# Patient Record
Sex: Male | Born: 1955 | Race: White | Hispanic: No | Marital: Married | Smoking: Former smoker
Health system: Southern US, Community
[De-identification: ages and names within clinical notes are randomized; demographics above are authoritative.]

## PROBLEM LIST (undated history)

## (undated) DIAGNOSIS — E7800 Pure hypercholesterolemia, unspecified: Secondary | ICD-10-CM

## (undated) DIAGNOSIS — D229 Melanocytic nevi, unspecified: Secondary | ICD-10-CM

## (undated) DIAGNOSIS — K5090 Crohn's disease, unspecified, without complications: Secondary | ICD-10-CM

## (undated) DIAGNOSIS — I10 Essential (primary) hypertension: Secondary | ICD-10-CM

## (undated) DIAGNOSIS — E785 Hyperlipidemia, unspecified: Secondary | ICD-10-CM

## (undated) HISTORY — DX: Melanocytic nevi, unspecified: D22.9

## (undated) HISTORY — DX: Pure hypercholesterolemia, unspecified: E78.00

## (undated) HISTORY — DX: Essential (primary) hypertension: I10

## (undated) HISTORY — DX: Hyperlipidemia, unspecified: E78.5

## (undated) HISTORY — PX: NO PAST SURGERIES: SHX2092

## (undated) HISTORY — DX: Crohn's disease, unspecified, without complications: K50.90

---

## 2005-07-24 ENCOUNTER — Ambulatory Visit: Payer: Self-pay | Admitting: Gastroenterology

## 2007-08-16 ENCOUNTER — Emergency Department (HOSPITAL_COMMUNITY): Admission: EM | Admit: 2007-08-16 | Discharge: 2007-08-17 | Payer: Self-pay | Admitting: Emergency Medicine

## 2014-05-02 ENCOUNTER — Encounter: Payer: Self-pay | Admitting: Cardiology

## 2014-06-20 ENCOUNTER — Ambulatory Visit: Payer: Self-pay | Admitting: Cardiology

## 2014-07-26 ENCOUNTER — Ambulatory Visit (INDEPENDENT_AMBULATORY_CARE_PROVIDER_SITE_OTHER): Payer: BC Managed Care – PPO | Admitting: Cardiology

## 2014-07-26 ENCOUNTER — Encounter: Payer: Self-pay | Admitting: Cardiology

## 2014-07-26 VITALS — BP 132/92 | HR 49 | Ht 68.25 in | Wt 181.0 lb

## 2014-07-26 DIAGNOSIS — I10 Essential (primary) hypertension: Secondary | ICD-10-CM

## 2014-07-26 DIAGNOSIS — R001 Bradycardia, unspecified: Secondary | ICD-10-CM | POA: Insufficient documentation

## 2014-07-26 DIAGNOSIS — I498 Other specified cardiac arrhythmias: Secondary | ICD-10-CM

## 2014-07-26 NOTE — Progress Notes (Signed)
[place] [address] [place], [place]  [address] Phone: [phone] Fax:  [phone]  Date:  07/26/2014   ID:  Michael Hernandez, DOB 06-Jul-1956, MRN [id number]  PCP:  Vidal Schwalbe, MD   History of Present Illness: Michael Hernandez is a 58 y.o. male with a history of hypertension, hyperlipidemia here for followup of marked bradycardia heart rate 44 with widened QRS. EKG performed on 05/19/12 demonstrated sinus bradycardia with interventricular conduction delay. PR interval is 150 ms. QRS duration is 118 ms. His TSH is normal, LDL was 146 and his electrolytes were normal. He has a history of Crohn's disease. He is not on any AV nodal blocking agents.   When he wakes up it is slow. 40's at time.   he did have an episode of what sounds like orthostatic hypotension in the middle of the night, awoke in, went to the bathroom, briefly felt like he lost consciousness. Stable otherwise. She's not having any symptoms when driving, working out. Heart rate 138 after Elliptical.   He does occasionally have left chest discomfort when working out which seems to be more musculoskeletal in character, somewhat constant at times, nonexertional, he is left-handed.   Wt Readings from Last 3 Encounters:  07/26/14 181 lb (82.101 kg)     Past Medical History  Diagnosis Date  . Hyperlipidemia   . Hypertension     BradyCardia heart rate 44 with Widened QRS. EKG on 05/19/12  demonstrates sinus bradycardia with interventricular conduction delay. PR Interval is 191ms  . Crohn's disease     [name]  . Nevus     with slight to moderate atypia  . Hypercholesteremia     No past surgical history on file.  Current Outpatient Prescriptions  Medication Sig Dispense Refill  . benazepril (LOTENSIN) 20 MG tablet Take 20 mg by mouth daily. 1/2 Tablet Daily      . Multiple Vitamin (MULTIVITAMIN) capsule Take 1 capsule by mouth daily.       No current facility-administered medications for this visit.     Allergies:    Allergies  Allergen Reactions  . Diltiazem Hcl     Avoid due to Bradycardia  . Metoprolol     Avoid due to Bradycardia    Social History:  The patient  reports that he has quit smoking. He does not have any smokeless tobacco history on file.   Family History  Problem Relation Age of Onset  . Hypertension Mother   . Multiple sclerosis Mother   . COPD Mother   . CVA Maternal Grandfather 47    ROS:  Please see the history of present illness.  No bleeding, no shortness of breath.  All other systems reviewed and negative.   PHYSICAL EXAM: VS:  BP 132/92  Pulse 49  Wt 181 lb (82.101 kg) Well nourished, well developed, in no acute distress HEENT: normal, [place]/AT, EOMI Neck: no JVD, normal carotid upstroke, no bruit Cardiac:  normal S1, S2; RRR; no murmur Lungs:  clear to auscultation bilaterally, no wheezing, rhonchi or rales Abd: soft, nontender, no hepatomegaly, no bruits Ext: no edema, 2+ distal pulses Skin: warm and dry GU: deferred Neuro: no focal abnormalities noted, AAO x 3  EKG:  07/26/14-sinus bradycardia rate 49, left axis deviation, no significant change from prior. Normal intervals.    Labs: Tc around 200.   ASSESSMENT AND PLAN:  1. Sinus bradycardia-continue to monitor, no high-risk symptoms such as syncope, angina. He  is not on any AV nodal blocking agents. He is able to exercise demonstrating chronotropic competence. Doing well. 2. Orthostatic hypotension - infrequent occurrence. Be careful when getting out of bed especially in the middle of the night. We discussed 3. One-year followup.  Signed, Candee Furbish, MD [facility]  07/26/2014 9:51 AM

## 2014-07-26 NOTE — Patient Instructions (Signed)
The current medical regimen is effective;  continue present plan and medications.  Follow up in 1 year with Dr Skains.  You will receive a letter in the mail 2 months before you are due.  Please call us when you receive this letter to schedule your follow up appointment.  

## 2015-08-23 ENCOUNTER — Telehealth: Payer: Self-pay | Admitting: Cardiology

## 2015-08-23 NOTE — Telephone Encounter (Signed)
As he was told on the phone to increase his fluid status which I agree with. His blood pressure was elevated once he checked it at home but this could've been exacerbated by stress. His bradycardia, heart rates of 44 for instance, have been quite chronic. I would like to encourage conservative management at this time with hydration, eating adequate breakfast. If symptoms return, we will have low threshold for 30 day event monitor to make sure that his sinus bradycardia is not decreasing significantly.  Candee Furbish, MD

## 2015-08-23 NOTE — Telephone Encounter (Signed)
New Message  Pt c/o Syncope: STAT if syncope occurred within 30 minutes and pt complains of lightheadedness High Priority if episode of passing out, completely, today or in last 24 hours   1. Did you pass out today? No   2. When is the last time you passed out? No   3. Has this occurred multiple times? NO  4. Did you have any symptoms prior to passing out? No  Comments: Pt called states that he has not passed out but he does have symptoms of being light headed. We have made him an appt with Dr. Marlou Porch. Pt declined appt for 11/01 with PA-C for a sooner appt. Please assist.

## 2015-08-23 NOTE — Telephone Encounter (Signed)
Spoke with pt about his s/s today - he reports he felt like he was having trouble focusing his eyes this AM around 9 while sitting at his desk.  He felt faint and got up to walk around.  His vision cleared up but he is not feeling himself now and has somewhat of a headache.  He went home to check his BP/P which was 155/96 and 47.  The heart rate is not abnormal for him however the BP is elevated from his normal.  At repeat it was 153/91/44.  He is going to continue to monitor it.  He is aware I will forward this information to Dr Marlou Porch for review and further instructions.

## 2015-08-23 NOTE — Telephone Encounter (Signed)
Reviewed information with pt - who states understanding.  He reports he thinks he is starting to get some type of cold or something starting.  H/A and eyes burning like he maybe has a fever or something.  Highest BP today was 179/93 however pt admits to making himself very anxious about things and causing his BP to be elevated. Last BP131/81 HR 44. He will continue to monitor his BP and HR and let us know if he has any further s/s.  He is aware Dr Marlou Porch may order for him to wear a 30 day event monitor if s/s continue along with the bradycardia.

## 2015-09-24 ENCOUNTER — Encounter: Payer: Self-pay | Admitting: Cardiology

## 2015-09-24 ENCOUNTER — Ambulatory Visit (INDEPENDENT_AMBULATORY_CARE_PROVIDER_SITE_OTHER): Payer: BLUE CROSS/BLUE SHIELD | Admitting: Cardiology

## 2015-09-24 VITALS — BP 180/110 | HR 54 | Ht 69.0 in | Wt 182.1 lb

## 2015-09-24 DIAGNOSIS — R079 Chest pain, unspecified: Secondary | ICD-10-CM | POA: Diagnosis not present

## 2015-09-24 DIAGNOSIS — R0600 Dyspnea, unspecified: Secondary | ICD-10-CM | POA: Diagnosis not present

## 2015-09-24 DIAGNOSIS — I10 Essential (primary) hypertension: Secondary | ICD-10-CM | POA: Diagnosis not present

## 2015-09-24 DIAGNOSIS — R001 Bradycardia, unspecified: Secondary | ICD-10-CM

## 2015-09-24 NOTE — Patient Instructions (Signed)
Medication Instructions:  The current medical regimen is effective;  continue present plan and medications.  Testing/Procedures: Your physician has requested that you have an echocardiogram. Echocardiography is a painless test that uses sound waves to create images of your heart. It provides your doctor with information about the size and shape of your heart and how well your heart's chambers and valves are working. This procedure takes approximately one hour. There are no restrictions for this procedure.  Your physician has requested that you have an exercise tolerance test. For further information please visit [email or web address]. Please also follow instruction sheet, as given.   Follow-Up: Follow up will be based on the results of the above testing.  If you need a refill on your cardiac medications before your next appointment, please call your pharmacy.  Thank you for choosing [facility]!!

## 2015-09-24 NOTE — Progress Notes (Signed)
[place]. [address], [address] [place], [place]  [address] Phone: [phone] Fax:  [phone]  Date:  09/24/2015   ID:  WILKINS MANCE, DOB Jan 19, 1956, MRN [id number]  PCP:  Vidal Schwalbe, MD   History of Present Illness: Michael Hernandez is a 59 y.o. male with a history of hypertension, hyperlipidemia here for followup of marked bradycardia heart rate 44 with widened QRS. EKG performed on 05/19/12 demonstrated sinus bradycardia with interventricular conduction delay. PR interval is 150 ms. QRS duration is 118 ms. His TSH is normal, LDL was 146 and his electrolytes were normal. He has a history of Crohn's disease. He is not on any AV nodal blocking agents.   More winded when going up stairs. Worried.   Was sitting at desk, trouble focusing. Felt funny. Blurry. Strange. Dizzy like empty stomach. Head down. Panicing. Walked around, 5 min went away. BP was high at time.   When he wakes up it is slow. 40's at time.  He did have an episode of what sounds like orthostatic hypotension in the middle of the night, awoke in, went to the bathroom, briefly felt like he lost consciousness. Stable otherwise. She's not having any symptoms when driving, working out. Heart rate 138 after Elliptical. 60 min.  He is off of his Lotensin.   He was worried about his blood pressure becoming elevated. Taking cold medication. 08/23/15.  He does occasionally have left chest discomfort when working out which seems to be more musculoskeletal in character, somewhat constant at times, nonexertional, he is left-handed.   Wt Readings from Last 3 Encounters:  09/24/15 182 lb 1.9 oz (82.609 kg)  07/26/14 181 lb (82.101 kg)     Past Medical History  Diagnosis Date  . Hyperlipidemia   . Hypertension     BradyCardia heart rate 44 with Widened QRS. EKG on 05/19/12  demonstrates sinus bradycardia with interventricular conduction delay. PR Interval is 198ms  . Crohn's disease ([place])     [place]  . Nevus     with  slight to moderate atypia  . Hypercholesteremia     No past surgical history on file.  Current Outpatient Prescriptions  Medication Sig Dispense Refill  . Multiple Vitamin (MULTIVITAMIN) capsule Take 1 capsule by mouth daily.     No current facility-administered medications for this visit.    Allergies:    Allergies  Allergen Reactions  . Diltiazem Hcl     Avoid due to Bradycardia  . Metoprolol     Avoid due to Bradycardia    Social History:  The patient  reports that he has quit smoking. He does not have any smokeless tobacco history on file.   Family History  Problem Relation Age of Onset  . Hypertension Mother   . Multiple sclerosis Mother   . COPD Mother   . CVA Maternal Grandfather 36    ROS:  Please see the history of present illness.  No bleeding, no shortness of breath.  All other systems reviewed and negative.   PHYSICAL EXAM: VS:  BP 180/110 mmHg  Pulse 54  Ht 5\' 9"  (1.753 m)  Wt 182 lb 1.9 oz (82.609 kg)  BMI 26.88 kg/m2 Well nourished, well developed, in no acute distress HEENT: normal, [place]/AT, EOMI Neck: no JVD, normal carotid upstroke, no bruit Cardiac:  normal S1, S2;  Regular, mildly bradycardic; no murmur Lungs:  clear to auscultation bilaterally, no wheezing, rhonchi or rales Abd: soft, nontender, no hepatomegaly, no bruits  Ext: no edema, 2+ distal pulses Skin: warm and dry GU: deferred Neuro: no focal abnormalities noted, AAO x 3  EKG:   Today 09/24/15-sinus bradycardia rate 49, left anterior fascicular block personally viewed-no change from prior 07/26/14-sinus bradycardia rate 49, left axis deviation, no significant change from prior. Normal intervals.    Labs: Tc around 200.   ASSESSMENT AND PLAN:  1.  atypical chest pain/dyspnea-I will go ahead and order an echocardiogram to ensure proper structure and function of his heart. I will also check an exercise treadmill test. He has Crohn's disease he states occasionally will have heartburn.  Anxiety is also playing a role sometimes he thinks. 2. Sinus bradycardia-continue to monitor, no high-risk symptoms such as syncope, angina. He is not on any AV nodal blocking agents. He is able to exercise demonstrating chronotropic competence. Doing well. Exercise treadmill test will help to determine chronotropic competence as well. 3. Orthostatic hypotension - infrequent occurrence. Be careful when getting out of bed especially in the middle of the night. We discussed.  I think that this was playing a role in his symptoms previously when his vision was blurry. Perhaps he was hypotensive. Anxiety can also precipitate this. 4. Essential hypertension - elevated today.  He is off of ACE inhibitor showed me several blood pressure readings that were within normal range. He admits that he does have whitecoat hypertension as well. 5. Anxiety - worried.  I asked him to discuss this further with Dr. Dema Severin. He is worried that he is going panic attack. 6. One-year followup. We will follow-up with results of testing.  Signed, Candee Furbish, MD [facility]  09/24/2015 9:44 AM

## 2015-10-11 ENCOUNTER — Encounter: Payer: BLUE CROSS/BLUE SHIELD | Admitting: Cardiology

## 2015-10-11 ENCOUNTER — Ambulatory Visit (INDEPENDENT_AMBULATORY_CARE_PROVIDER_SITE_OTHER): Payer: BLUE CROSS/BLUE SHIELD

## 2015-10-11 ENCOUNTER — Other Ambulatory Visit: Payer: Self-pay

## 2015-10-11 ENCOUNTER — Ambulatory Visit (HOSPITAL_COMMUNITY): Payer: BLUE CROSS/BLUE SHIELD | Attending: Cardiology

## 2015-10-11 DIAGNOSIS — R079 Chest pain, unspecified: Secondary | ICD-10-CM | POA: Diagnosis not present

## 2015-10-11 DIAGNOSIS — R001 Bradycardia, unspecified: Secondary | ICD-10-CM | POA: Diagnosis present

## 2015-10-11 DIAGNOSIS — I517 Cardiomegaly: Secondary | ICD-10-CM | POA: Insufficient documentation

## 2015-10-11 DIAGNOSIS — I10 Essential (primary) hypertension: Secondary | ICD-10-CM | POA: Diagnosis not present

## 2015-10-11 DIAGNOSIS — I340 Nonrheumatic mitral (valve) insufficiency: Secondary | ICD-10-CM | POA: Diagnosis not present

## 2015-10-11 LAB — EXERCISE TOLERANCE TEST
CHL CUP MPHR: 163 {beats}/min
CHL CUP RESTING HR STRESS: 51 {beats}/min

## 2015-10-11 MED ORDER — AMLODIPINE BESYLATE 5 MG PO TABS: 5.0000 mg | ORAL_TABLET | Freq: Every day | ORAL | Status: DC

## 2015-10-16 ENCOUNTER — Other Ambulatory Visit: Payer: Self-pay | Admitting: Family Medicine

## 2015-10-16 ENCOUNTER — Ambulatory Visit
Admission: RE | Admit: 2015-10-16 | Discharge: 2015-10-16 | Disposition: A | Discharge status: Home / Self Care | Payer: BLUE CROSS/BLUE SHIELD | Source: Ambulatory Visit | Attending: Family Medicine | Admitting: Family Medicine

## 2015-10-16 DIAGNOSIS — R079 Chest pain, unspecified: Secondary | ICD-10-CM

## 2015-10-30 ENCOUNTER — Encounter: Payer: BLUE CROSS/BLUE SHIELD | Admitting: Physician Assistant

## 2016-01-31 DIAGNOSIS — E785 Hyperlipidemia, unspecified: Secondary | ICD-10-CM | POA: Diagnosis not present

## 2016-07-22 DIAGNOSIS — I10 Essential (primary) hypertension: Secondary | ICD-10-CM | POA: Diagnosis not present

## 2016-07-22 DIAGNOSIS — K5090 Crohn's disease, unspecified, without complications: Secondary | ICD-10-CM | POA: Diagnosis not present

## 2016-07-22 DIAGNOSIS — E785 Hyperlipidemia, unspecified: Secondary | ICD-10-CM | POA: Diagnosis not present

## 2016-07-22 DIAGNOSIS — Z23 Encounter for immunization: Secondary | ICD-10-CM | POA: Diagnosis not present

## 2016-07-22 DIAGNOSIS — Z125 Encounter for screening for malignant neoplasm of prostate: Secondary | ICD-10-CM | POA: Diagnosis not present

## 2016-07-22 DIAGNOSIS — Z0000 Encounter for general adult medical examination without abnormal findings: Secondary | ICD-10-CM | POA: Diagnosis not present

## 2016-10-10 DIAGNOSIS — L700 Acne vulgaris: Secondary | ICD-10-CM | POA: Diagnosis not present

## 2016-11-06 ENCOUNTER — Ambulatory Visit (INDEPENDENT_AMBULATORY_CARE_PROVIDER_SITE_OTHER): Payer: BLUE CROSS/BLUE SHIELD | Admitting: Podiatry

## 2016-11-06 ENCOUNTER — Encounter: Payer: Self-pay | Admitting: Podiatry

## 2016-11-06 ENCOUNTER — Ambulatory Visit (INDEPENDENT_AMBULATORY_CARE_PROVIDER_SITE_OTHER): Payer: BLUE CROSS/BLUE SHIELD

## 2016-11-06 ENCOUNTER — Ambulatory Visit: Payer: BLUE CROSS/BLUE SHIELD

## 2016-11-06 VITALS — Resp 16 | Ht 69.5 in | Wt 180.0 lb

## 2016-11-06 DIAGNOSIS — M79672 Pain in left foot: Secondary | ICD-10-CM

## 2016-11-06 DIAGNOSIS — M79671 Pain in right foot: Secondary | ICD-10-CM | POA: Diagnosis not present

## 2016-11-06 DIAGNOSIS — M2022 Hallux rigidus, left foot: Secondary | ICD-10-CM

## 2016-11-06 DIAGNOSIS — M7752 Other enthesopathy of left foot: Secondary | ICD-10-CM | POA: Diagnosis not present

## 2016-11-06 DIAGNOSIS — M2021 Hallux rigidus, right foot: Secondary | ICD-10-CM

## 2016-11-06 DIAGNOSIS — M779 Enthesopathy, unspecified: Secondary | ICD-10-CM

## 2016-11-06 MED ORDER — TRIAMCINOLONE ACETONIDE 10 MG/ML IJ SUSP
10.0000 mg | Freq: Once | INTRAMUSCULAR | Status: AC
  Administered 2016-11-06: 10 mg

## 2016-11-06 NOTE — Progress Notes (Signed)
   Subjective:    Patient ID: Michael Hernandez, male    DOB: [birth year], 61 y.o.   MRN: [id number]  HPI  Chief Complaint  Patient presents with  . Foot Pain    Left foot; Top of foot beneath great toe x 2.5 weeks. Pt states that he fell going down the stairs and slammed his foot on a wooden floor, he has been having pain on top of his foot since.   . Small Cyst    Left, Second toe "near toe nail" x "several cyst". Pt thinks that it could be myxoid cyst, every 3 months it becomes fluid filled with thick clear drainage.   Marland Kitchen KNOT or Possible Bunion    Right foot, medial side x 4 years. Pt states that "it is not painful unless wearing tight shoes"       Review of Systems     Objective:   Physical Exam        Assessment & Plan:

## 2016-11-06 NOTE — Patient Instructions (Signed)
Hallux Rigidus Introduction Hallux rigidus is a type of joint pain or joint disease (arthritis) that affects your big toe (hallux). This condition involves the joint that connects the base of your big toe to the main part of your foot (metatarsophalangeal joint). This condition can cause your big toe to become stiff, painful, and difficult to move. Symptoms may get worse with movement or in cold or damp weather. The condition also gets worse over time. What are the causes? This condition may be caused by having a foot that does not function the way that it should or has an abnormal shape (structural deformity). These foot problems can run in families (be hereditary). This condition can also be caused by:  Injury.  Overuse.  Certain inflammatory diseases, including gout and rheumatoid arthritis. What increases the risk? This condition is more likely to develop in people who:  Have a foot bone (metatarsal) that is longer or higher than normal.  Have a family history of hallux rigidus.  Have previously injured their big toe.  Have feet that do not have a curve (arch) on the inner side of the foot. This may be called flat feet or fallen arches.  Turn their ankles in when they walk (pronation).  Have rheumatoid arthritis or gout.  Have to stoop down often at work. What are the signs or symptoms? Symptoms of this condition include:  Big toe pain.  Stiffness and difficulty moving the big toe.  Swelling of the toe and surrounding area.  Bone spurs. These are bony growths that can form on the joint of the big toe.  A limp. How is this diagnosed? This condition is diagnosed based on a medical history and physical exam. This may include X-rays. How is this treated? Treatment for this condition includes:  Wearing roomy, comfortable shoes that have a large toe box.  Putting orthotic devices in your shoes.  Pain medicines.  Physical therapy.  Icing the injured  area.  Alternate between putting your foot in cold water then warm water. If your condition is severe, treatment may include:  Corticosteroid injections to relieve pain.  Surgery to remove bone spurs, fuse damaged bones together, or replace the entire joint. Follow these instructions at home:  Take over-the-counter and prescription medicines only as told by your health care provider.  Do not wear high heels or other restrictive footwear. Wear comfortable, supportive shoes that have a large toe box.  Wear orthotics as told by your health care provider, if this applies.  Put your feet in cold water for 30 seconds, then in warm water for 30 seconds. Alternate between the cold and warm water for 5 minutes. Do this several times a day or as told by your health care provider.  If directed, apply ice to the injured area.  Put ice in a plastic bag.  Place a towel between your skin and the bag.  Leave the ice on for 20 minutes, 2-3 times per day.  Do foot exercises as instructed by your health care provider or a physical therapist.  Keep all follow-up visits as told by your health care provider. This is important. Contact a health care provider if:  You notice bone spurs or growths on or around your big toe.  Your pain does not get better or it gets worse.  You have pain while resting.  You have pain in other parts of your body, such as your back, hip, or knee.  You start to limp. This information is not  intended to replace advice given to you by your health care provider. Make sure you discuss any questions you have with your health care provider. Document Released: 10/13/2005 Document Revised: 03/20/2016 Document Reviewed: 06/20/2015  2017 Elsevier

## 2016-11-09 NOTE — Progress Notes (Signed)
Subjective:     Patient ID: Michael Hernandez, male   DOB: 27-Jun-1956, 61 y.o.   MRN: [id number]  HPI patient states I developed a lot of pain around my big toe joint and I did have an injury and it continues to bother me when I try to walk. Patient also has pain in the medial side of the right foot that's tender when pressed has a small cyst on the second toe left that he complains about   Review of Systems  All other systems reviewed and are negative.      Objective:   Physical Exam  Constitutional: He is oriented to person, place, and time.  Cardiovascular: Intact distal pulses.   Musculoskeletal: Normal range of motion.  Neurological: He is oriented to person, place, and time.  Skin: Skin is warm.  Nursing note and vitals reviewed.  neurovascular status intact muscle strength adequate range of motion within normal limits with patient noted to have discomfort around the first MPJ left with inflammation fluid buildup noted around the joint. Patient's found to have good digital perfusion well oriented 3 and has a small cyst on the second digit distal left and discomfort in the right foot of a nondescript nature     Assessment:     Probable inflammatory capsulitis first MPJ left secondary to trauma with patient also noted to have mucoid cyst second digit left and discomfort right foot which may be due to gait status    Plan:     H&P and all conditions reviewed and careful injection administered around the first MPJ left with reduction of inflammation to accomplish. I then advised on rigid bottom shoes and do not recommend treatment currently for the distal second toe or right foot but may be necessary depending on the progression  X-ray indicates that there was no damage to the first MPJ left secondary to trauma with mild soft tissue inflammatory process and no other significant pathology with moderate flatfoot deformity noted

## 2017-01-20 DIAGNOSIS — E785 Hyperlipidemia, unspecified: Secondary | ICD-10-CM | POA: Diagnosis not present

## 2017-01-20 DIAGNOSIS — I10 Essential (primary) hypertension: Secondary | ICD-10-CM | POA: Diagnosis not present

## 2017-02-19 ENCOUNTER — Ambulatory Visit (INDEPENDENT_AMBULATORY_CARE_PROVIDER_SITE_OTHER): Payer: BLUE CROSS/BLUE SHIELD | Admitting: Podiatry

## 2017-02-19 ENCOUNTER — Ambulatory Visit (INDEPENDENT_AMBULATORY_CARE_PROVIDER_SITE_OTHER): Payer: BLUE CROSS/BLUE SHIELD

## 2017-02-19 DIAGNOSIS — M1000 Idiopathic gout, unspecified site: Secondary | ICD-10-CM | POA: Diagnosis not present

## 2017-02-19 DIAGNOSIS — M779 Enthesopathy, unspecified: Secondary | ICD-10-CM | POA: Diagnosis not present

## 2017-02-19 DIAGNOSIS — M2020 Hallux rigidus, unspecified foot: Secondary | ICD-10-CM

## 2017-02-19 MED ORDER — TRIAMCINOLONE ACETONIDE 10 MG/ML IJ SUSP
10.0000 mg | Freq: Once | INTRAMUSCULAR | Status: AC
  Administered 2017-02-19: 10 mg

## 2017-02-19 NOTE — Patient Instructions (Signed)

## 2017-02-20 LAB — ANA, IFA COMPREHENSIVE PANEL
ANA: NEGATIVE
DS DNA AB: 1 [IU]/mL
ENA SM Ab Ser-aCnc: <1
SM/RNP: <1
SSA (RO) (ENA) ANTIBODY, IGG: NEGATIVE
SSB (LA) (ENA) ANTIBODY, IGG: NEGATIVE
Scleroderma (Scl-70) (ENA) Antibody, IgG: <1

## 2017-02-20 LAB — URIC ACID: Uric Acid, Serum: 7.1 mg/dL (ref 4.0–8.0)

## 2017-02-20 LAB — C-REACTIVE PROTEIN: CRP: 3.4 mg/L (ref <8.0)

## 2017-02-20 LAB — SEDIMENTATION RATE: Sed Rate: 7 mm/hr (ref 0–20)

## 2017-02-20 LAB — RHEUMATOID FACTOR

## 2017-02-20 NOTE — Progress Notes (Signed)
Subjective:    Patient ID: Pearletha Forge, male   DOB: 61 y.o.   MRN: [id number]   HPI patient presents stating my big toe joint right is really bothering me and I know the spur hurts but I also get inflammation which has formed around the big toe joint that started last Saturday and is intense in its orientation    ROS      Objective:  Physical Exam Neurovascular status intact muscle strength adequate with patient found to have inflammation fluid around the first metatarsal head right that's very painful when pressed with large dorsal spurring that's noted around the first metatarsal head with significant range of motion loss    Assessment:     Concerned that we may be dealing with gout or other systemic inflammatory condition versus his long-term hallux limitus rigidus deformity     Plan:     H&P condition reviewed and today I went ahead and I injected the area of inflamed capsule 3 Milligan Kenalog 5 mill grams Xylocaine and sent for blood work in order to make sure gout is not a problem for this patient. Patient was given sheets about gout and we discussed it's condition and foods to avoid  X-rays indicate there is been slight increased consolidation of the dorsal spurring with narrowing of the joint and soft tissue distention around the medial side first metatarsal

## 2017-03-05 ENCOUNTER — Encounter: Payer: Self-pay | Admitting: Podiatry

## 2017-03-05 ENCOUNTER — Ambulatory Visit (INDEPENDENT_AMBULATORY_CARE_PROVIDER_SITE_OTHER): Payer: BLUE CROSS/BLUE SHIELD | Admitting: Podiatry

## 2017-03-05 DIAGNOSIS — M779 Enthesopathy, unspecified: Secondary | ICD-10-CM | POA: Diagnosis not present

## 2017-03-05 DIAGNOSIS — M2020 Hallux rigidus, unspecified foot: Secondary | ICD-10-CM | POA: Diagnosis not present

## 2017-03-05 DIAGNOSIS — M1000 Idiopathic gout, unspecified site: Secondary | ICD-10-CM

## 2017-03-05 NOTE — Progress Notes (Signed)
Subjective:    Patient ID: Michael Hernandez, male   DOB: 61 y.o.   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 I discussed correction of the structural deformity wants to get it done but needs to wait for later this summer as he is instructed to call get scheduled and then see me back prior to procedure

## 2017-09-01 DIAGNOSIS — Z125 Encounter for screening for malignant neoplasm of prostate: Secondary | ICD-10-CM | POA: Diagnosis not present

## 2017-09-01 DIAGNOSIS — Z23 Encounter for immunization: Secondary | ICD-10-CM | POA: Diagnosis not present

## 2017-09-01 DIAGNOSIS — E785 Hyperlipidemia, unspecified: Secondary | ICD-10-CM | POA: Diagnosis not present

## 2017-09-01 DIAGNOSIS — B001 Herpesviral vesicular dermatitis: Secondary | ICD-10-CM | POA: Diagnosis not present

## 2017-09-01 DIAGNOSIS — I10 Essential (primary) hypertension: Secondary | ICD-10-CM | POA: Diagnosis not present

## 2017-09-01 DIAGNOSIS — Z0000 Encounter for general adult medical examination without abnormal findings: Secondary | ICD-10-CM | POA: Diagnosis not present

## 2018-03-29 DIAGNOSIS — I10 Essential (primary) hypertension: Secondary | ICD-10-CM | POA: Diagnosis not present

## 2018-09-18 ENCOUNTER — Emergency Department
Admission: EM | Admit: 2018-09-18 | Discharge: 2018-09-18 | Disposition: A | Discharge status: Home / Self Care | Payer: BLUE CROSS/BLUE SHIELD | Attending: Emergency Medicine | Admitting: Emergency Medicine

## 2018-09-18 ENCOUNTER — Encounter: Payer: Self-pay | Admitting: Emergency Medicine

## 2018-09-18 ENCOUNTER — Other Ambulatory Visit: Payer: Self-pay

## 2018-09-18 DIAGNOSIS — R202 Paresthesia of skin: Secondary | ICD-10-CM | POA: Diagnosis not present

## 2018-09-18 DIAGNOSIS — R200 Anesthesia of skin: Secondary | ICD-10-CM | POA: Insufficient documentation

## 2018-09-18 DIAGNOSIS — I10 Essential (primary) hypertension: Secondary | ICD-10-CM | POA: Diagnosis not present

## 2018-09-18 DIAGNOSIS — M542 Cervicalgia: Secondary | ICD-10-CM | POA: Diagnosis not present

## 2018-09-18 DIAGNOSIS — M5412 Radiculopathy, cervical region: Secondary | ICD-10-CM | POA: Diagnosis not present

## 2018-09-18 DIAGNOSIS — M25512 Pain in left shoulder: Secondary | ICD-10-CM | POA: Diagnosis not present

## 2018-09-18 DIAGNOSIS — Z87891 Personal history of nicotine dependence: Secondary | ICD-10-CM | POA: Diagnosis not present

## 2018-09-18 MED ORDER — PREDNISONE 10 MG PO TABS: 10.0000 mg | ORAL_TABLET | Freq: Every day | ORAL | 0 refills | Status: DC

## 2018-09-18 NOTE — ED Notes (Signed)
Pt  Reports  Symptoms  Of aching I l  Arm  And  l  Side recent  Physical   Activity   Pt  Reports  Neck  Pain   As  Well   Symptoms  X  2  Days

## 2018-09-18 NOTE — ED Triage Notes (Signed)
Pt to ed with c/o left shoulder, left neck and left arm pain x several days.  Pt also reports elevated blood pressure today.  Hx of HTN.  Pt denies CP at this time.

## 2018-09-18 NOTE — Discharge Instructions (Addendum)
Please take prednisone taper as prescribed.  You may take Tylenol with prednisone as needed.  Please return to the emergency department for any increasing pain worsening symptoms or to changes in health.

## 2018-09-18 NOTE — ED Provider Notes (Signed)
[facility] EMERGENCY DEPARTMENT Provider Note   CSN: [id number] Arrival date & time: 09/18/18  1425     History   Chief Complaint Chief Complaint  Patient presents with  . Arm Pain  . Neck Pain  . Shoulder Pain    HPI Michael Hernandez is a 62 y.o. male presents to the emergency department for evaluation of left-sided neck pain and aching pain in the left shoulder blade.  Patient states symptoms been present for 2 days.  Has been performing a lot of physical activity as he recently sold his house and moved into a new home.  He has been performing a lot of lifting of furniture and boxes.  Patient states the pain is mild to moderate and occasionally he will have a shooting pain going down the left arm with numbness and tingling along the ulnar aspect of the forearm and into the last 2-3 digits of the left hand.  He denies any weakness.  He has seen some improvement with over-the-counter medications.  He denies any chest pain, shortness of breath, nausea, vomiting or diaphoresis.  He has a history of hypertension, states he has significant whitecoat syndrome.  He was taking his blood pressure at home and as he became worried about his neck pain and left arm pain his blood pressure continued to rise each time he checked his BP.  He denies any headache, vision changes.  HPI  Past Medical History:  Diagnosis Date  . Crohn's disease ([place])    [place]  . Hypercholesteremia   . Hyperlipidemia   . Hypertension    BradyCardia heart rate 44 with Widened QRS. EKG on 05/19/12  demonstrates sinus bradycardia with interventricular conduction delay. PR Interval is 158ms  . Nevus    with slight to moderate atypia    Patient Active Problem List   Diagnosis Date Noted  . Sinus bradycardia 07/26/2014  . Essential hypertension 07/26/2014    Past Surgical History:  Procedure Laterality Date  . NO PAST SURGERIES          Home Medications    Prior to Admission medications    Medication Sig Start Date End Date Taking? Authorizing Provider  amLODipine (NORVASC) 5 MG tablet Take 1 tablet (5 mg total) by mouth daily. 10/11/15   Consuelo Pandy, PA-C  Multiple Vitamin (MULTIVITAMIN) capsule Take 1 capsule by mouth daily.    [provider]  predniSONE (DELTASONE) 10 MG tablet Take 1 tablet (10 mg total) by mouth daily. 6,5,4,3,2,1 six day taper 09/18/18   Duanne Guess, PA-C    Family History Family History  Problem Relation Age of Onset  . Hypertension Mother   . Multiple sclerosis Mother   . COPD Mother   . CVA Maternal Grandfather 80    Social History Social History   Tobacco Use  . Smoking status: Former Research scientist (life sciences)  . Smokeless tobacco: Never Used  Substance Use Topics  . Alcohol use: Never    Frequency: Never  . Drug use: Never     Allergies   Diltiazem hcl and Metoprolol   Review of Systems Review of Systems  Constitutional: Negative for fever.  Eyes: Negative for photophobia, pain and visual disturbance.  Respiratory: Negative for cough, chest tightness and shortness of breath.   Cardiovascular: Negative for chest pain.  Musculoskeletal: Positive for neck pain.  Skin: Negative for rash and wound.  Neurological: Positive for numbness. Negative for dizziness, facial asymmetry, weakness and headaches.     Physical  Exam Updated Vital Signs BP (!) 187/90 (BP Location: Right Arm)   Pulse 62   Temp 98.2 F (36.8 C) (Oral)   Resp 18   Wt 81.6 kg   SpO2 100%   BMI 26.19 kg/m   Physical Exam  Constitutional: He is oriented to person, place, and time. He appears well-developed and well-nourished.  HENT:  Head: Normocephalic and atraumatic.  Eyes: Conjunctivae are normal.  Neck: Normal range of motion.  Cardiovascular: Normal rate, regular rhythm and normal heart sounds.  Pulmonary/Chest: Effort normal. No stridor. No respiratory distress. He has no wheezes.  Musculoskeletal: Normal range of motion.  Cervical  Spine: Examination of the cervical spine reveals no bony abnormality, no edema, and no ecchymosis.  There is no step-off.  The patient has full active and passive range of motion of the cervical spine with flexion, extension, and right and left bend with rotation.  There is no crepitus with range of motion exercises.  The patient is non-tender along the spinous process to palpation.  The patient has no paravertebral muscle spasm.  There is no parascapular discomfort.  The patient has a negative axial compression test.  The patient has a positive Spurling's test to the left reproducing left shoulder blade pain and left arm pain with cervical extension. Left upper Extremity: Examination of the left shoulder and arm showed no bony abnormality or edema.  The patient has normal active and passive motion with abduction, flexion, internal rotation, and external rotation.  The patient has no tenderness with motion.  The patient has a negative Hawkins test and a negative impingement test.  The patient has a negative drop arm test.  The patient is non-tender along the deltoid muscle.  There is no subacromial space tenderness with no AC joint tenderness.  The patient has no instability of the shoulder with anterior-posterior motion.  There is a negative sulcus sign.  The rotator cuff muscle strength is 5/5 with supraspinatus, 5/5 with internal rotation, and 5/5 with external rotation.  There is no crepitus with range of motion activities.     Neurological: He is alert and oriented to person, place, and time. No cranial nerve deficit. Coordination normal.  Skin: Skin is warm. No rash noted.  Psychiatric: He has a normal mood and affect. His behavior is normal. Thought content normal.     ED Treatments / Results  Labs (all labs ordered are listed, but only abnormal results are displayed) Labs Reviewed - No data to display  EKG None  Radiology No results found.  Procedures Procedures (including critical care  time)  Medications Ordered in ED Medications - No data to display   Initial Impression / Assessment and Plan / ED Course  I have reviewed the triage vital signs and the nursing notes.  Pertinent labs & imaging results that were available during my care of the patient were reviewed by me and considered in my medical decision making (see chart for details).     62 year old male with left posterior neck pain extending into the superior scapular border with occasional radicular pain numbness and tingling extending down the left arm in a C6-C7 nerve distribution.  Patient with no neurological deficits or weakness in the upper extremities.  EKG in triage reviewed by me had normal showing no sign of ST elevation.  Patient was slightly elevated blood pressure but no headache vision changes or neurological deficits.  Patient admits to being very anxious and having white coat syndrome.  He will continue to  monitor blood pressure at home and he understands signs symptoms return to the ED for.  He is placed on a 6-day steroid taper.  We will follow-up PCP if no improvement.  Final Clinical Impressions(s) / ED Diagnoses   Final diagnoses:  Neck pain  Cervical radiculopathy    ED Discharge Orders         Ordered    predniSONE (DELTASONE) 10 MG tablet  Daily     09/18/18 1522           Renata Caprice 09/18/18 1543    Lavonia Drafts, MD 09/18/18 1910

## 2019-02-23 ENCOUNTER — Ambulatory Visit
Admission: RE | Admit: 2019-02-23 | Discharge: 2019-02-23 | Disposition: A | Discharge status: Home / Self Care | Payer: 59 | Source: Ambulatory Visit | Attending: Family Medicine | Admitting: Family Medicine

## 2019-02-23 ENCOUNTER — Other Ambulatory Visit: Payer: Self-pay

## 2019-02-23 ENCOUNTER — Other Ambulatory Visit: Payer: Self-pay | Admitting: Family Medicine

## 2019-02-23 DIAGNOSIS — M542 Cervicalgia: Secondary | ICD-10-CM

## 2019-06-15 ENCOUNTER — Other Ambulatory Visit: Payer: Self-pay | Admitting: Family Medicine

## 2019-06-15 DIAGNOSIS — M542 Cervicalgia: Secondary | ICD-10-CM

## 2019-06-29 ENCOUNTER — Ambulatory Visit: Payer: 59 | Admitting: Cardiology

## 2019-07-01 ENCOUNTER — Ambulatory Visit
Admission: RE | Admit: 2019-07-01 | Discharge: 2019-07-01 | Disposition: A | Discharge status: Home / Self Care | Payer: 59 | Source: Ambulatory Visit | Attending: Family Medicine | Admitting: Family Medicine

## 2019-07-01 ENCOUNTER — Other Ambulatory Visit: Payer: Self-pay | Admitting: Family Medicine

## 2019-07-01 ENCOUNTER — Other Ambulatory Visit: Payer: Self-pay

## 2019-07-01 DIAGNOSIS — Z77018 Contact with and (suspected) exposure to other hazardous metals: Secondary | ICD-10-CM

## 2019-07-01 DIAGNOSIS — M542 Cervicalgia: Secondary | ICD-10-CM

## 2019-08-01 ENCOUNTER — Ambulatory Visit: Payer: 59 | Admitting: Cardiology

## 2019-08-04 ENCOUNTER — Ambulatory Visit: Payer: 59 | Admitting: Cardiology

## 2019-09-06 ENCOUNTER — Ambulatory Visit (INDEPENDENT_AMBULATORY_CARE_PROVIDER_SITE_OTHER): Payer: Managed Care, Other (non HMO) | Admitting: Cardiology

## 2019-09-06 ENCOUNTER — Other Ambulatory Visit: Payer: Self-pay

## 2019-09-06 ENCOUNTER — Encounter: Payer: Self-pay | Admitting: Cardiology

## 2019-09-06 VITALS — BP 158/90 | HR 61 | Ht 69.5 in | Wt 182.0 lb

## 2019-09-06 DIAGNOSIS — R001 Bradycardia, unspecified: Secondary | ICD-10-CM

## 2019-09-06 DIAGNOSIS — R079 Chest pain, unspecified: Secondary | ICD-10-CM | POA: Diagnosis not present

## 2019-09-06 DIAGNOSIS — I10 Essential (primary) hypertension: Secondary | ICD-10-CM | POA: Diagnosis not present

## 2019-09-06 NOTE — Patient Instructions (Addendum)
Medication Instructions:  The current medical regimen is effective;  continue present plan and medications.  *If you need a refill on your cardiac medications before your next appointment, please call your pharmacy*  Testing/Procedures: Please call back about scheduling of Coronary CT when ready.  Follow-Up: At [facility], you and your health needs are our priority.  As part of our continuing mission to provide you with exceptional heart care, we have created designated Provider Care Teams.  These Care Teams include your primary Cardiologist (physician) and Advanced Practice Providers (APPs -  Physician Assistants and Nurse Practitioners) who all work together to provide you with the care you need, when you need it.  Your next appointment:   2 to 4 years  The format for your next appointment:   In Person  Provider:   Dr Candee Furbish  Thank you for choosing [facility]!!

## 2019-09-06 NOTE — Progress Notes (Signed)
Cardiology Office Note:    Date:  09/06/2019   ID:  Michael Hernandez, DOB 09-10-1956, MRN [id number]  PCP:  Michael Stains, MD  Cardiologist:  Candee Furbish, MD  Electrophysiologist:  None   Referring MD: Michael Stains, MD     History of Present Illness:    Michael Hernandez is a 63 y.o. male here for the evaluation of bradycardia at the request of Dr. Harlan Hernandez.  I previously saw him back in 2016 where he was noted to have marked bradycardia heart rate of 44 bpm with widened QRS.  An EKG performed in 2013 showed similar findings.  Has a history of Crohn's disease and is not on any AV nodal blocking agents.  Blood pressure has been challenging at times.  He does have a degree of whitecoat hypertension.  Sometimes even gets anxious at home when he is taking his blood pressures.  Dr. Dema Severin has increased his amlodipine to 5 mg twice a day.  This seems to be helping.  He has had in the past some episodes of what sounds like orthostatic hypotension in the middle the night.  He has been able to achieve a adequate heart rate during exercise.  Most heart rates have been in the 40s previously.  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  Been fighting a neck pain issue. Compressed disc. Pain in left arm, and left chest. Anxious, BP was much higher when he he went to ER.  Been low at times.   Has had syncope in the past with low blood pressure.  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  Denies any fevers chills nausea vomiting syncope bleeding at this point  Past Medical History:  Diagnosis Date  . Crohn's disease ([place])    Kaplan  . Hypercholesteremia   . Hyperlipidemia   . Hypertension    BradyCardia heart rate 44 with Widened QRS. EKG on 05/19/12  demonstrates sinus bradycardia with interventricular conduction delay. PR Interval is 176ms  . Nevus    with slight to moderate atypia    Past Surgical History:  Procedure  Laterality Date  . NO PAST SURGERIES      Current Medications: Current Meds  Medication Sig  . amLODipine (NORVASC) 10 MG tablet Take 10 mg by mouth daily.  . Multiple Vitamin (MULTIVITAMIN) capsule Take 1 capsule by mouth daily.     Allergies:   Diltiazem hcl and Metoprolol   Social History   Socioeconomic History  . Marital status: Married    Spouse name: Not on file  . Number of children: Not on file  . Years of education: Not on file  . Highest education level: Not on file  Occupational History  . Not on file  Social Needs  . Financial resource strain: Not on file  . Food insecurity    Worry: Not on file    Inability: Not on file  . Transportation needs    Medical: Not on file    Non-medical: Not on file  Tobacco Use  . Smoking status: Former Research scientist (life sciences)  . Smokeless tobacco: Never Used  Substance and Sexual Activity  . Alcohol use: Never    Frequency: Never  . Drug use: Never  . Sexual activity: Not on file  Lifestyle  . Physical activity    Days per week: Not on file    Minutes per session: Not on file  . Stress: Not on file  Relationships  .  Social Herbalist on phone: Not on file    Gets together: Not on file    Attends religious service: Not on file    Active member of club or organization: Not on file    Attends meetings of clubs or organizations: Not on file    Relationship status: Not on file  Other Topics Concern  . Not on file  Social History Narrative   Tobacco use cigarettes: Former Smoker. 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  ROS:   Please see the history of present illness.     All other systems reviewed and are negative.  EKGs/Labs/Other Studies Reviewed:    The following studies were reviewed today: 2016 exercise treadmill test normal 2016 echocardiogram normal  EKG:  EKG is  ordered today.  The ekg ordered today demonstrates normal sinus rhythm 61 left anterior fascicular block  Recent Labs: No results found for requested labs within last 8760 hours.  Recent Lipid Panel No results found for: CHOL, TRIG, HDL, CHOLHDL, VLDL, LDLCALC, LDLDIRECT  Physical Exam:    VS:  BP (!) 158/90   Pulse 61   Ht 5' 9.5" (1.765 m)   Wt 182 lb (82.6 kg)   SpO2 98%   BMI 26.49 kg/m     Wt Readings from Last 3 Encounters:  09/06/19 182 lb (82.6 kg)  09/18/18 179 lb 14.3 oz (81.6 kg)  11/06/16 180 lb (81.6 kg)     GEN:  Well nourished, well developed in no acute distress HEENT: Normal NECK: No JVD; No carotid bruits LYMPHATICS: No lymphadenopathy CARDIAC: RRR, no murmurs, rubs, gallops RESPIRATORY:  Clear to auscultation without rales, wheezing or rhonchi  ABDOMEN: Soft, non-tender, non-distended MUSCULOSKELETAL:  No edema; No deformity  SKIN: Warm and dry NEUROLOGIC:  Alert and oriented x 3 PSYCHIATRIC:  Normal affect   ASSESSMENT:    1. Chest pain of uncertain etiology   2. Essential hypertension   3. Sinus bradycardia    PLAN:    In order of problems listed above:  Sinus bradycardia -Longstanding.  No syncope.  No AV nodal blocking agents.  Has been able to demonstrate normal chronotropic competence in the past.  Today's EKG shows sinus rhythm rate 61.  Excellent.  Prior syncope -This is happened in the past with hypotension.  Sometimes it is challenging to determine whether heart rate or blood pressure is the culprit.  For him with normal resting bradycardia this can  be challenging.  In the past we have determined that he does have excellent chronotropic competence.  Chest pain -He has had in the past left-sided chest discomfort one episode which is accompanied with neck pain and high blood pressure that worried him enough to go to the emergency room for further evaluation.  Hopefully his left-sided chest discomfort was related to his musculoskeletal/neck discomfort however we will check a CT  scan of coronary arteries to ensure that he does not have any flow-limiting coronary artery disease.  He would like to order this test after his enrollment for new insurance has gone through.  Would be fine seeing him back in approximately 2 years  Medication Adjustments/Labs and Tests Ordered: Current medicines are reviewed at length with the patient today.  Concerns regarding medicines are outlined above.  Orders Placed This Encounter  Procedures  . EKG 12-Lead   No orders of the defined types were placed in this encounter.    Patient Instructions  Medication Instructions:  The current medical regimen is effective;  continue present plan and medications.  *If you need a refill on your cardiac medications before your next appointment, please call your pharmacy*  Testing/Procedures: Please call back about scheduling of Coronary CT when ready.  Follow-Up: At [facility], you and your health needs are our priority.  As part of our continuing mission to provide you with exceptional heart care, we have created designated Provider Care Teams.  These Care Teams include your primary Cardiologist (physician) and Advanced Practice Providers (APPs -  Physician Assistants and Nurse Practitioners) who all work together to provide you with the care you need, when you need it.  Your next appointment:   2 to 4 years  The format for your next appointment:   In Person  Provider:   Dr Candee Furbish  Thank you for choosing [facility]!!        Signed,  Candee Furbish, MD  09/06/2019 2:43 PM    [place]

## 2020-04-05 IMAGING — MR MR CERVICAL SPINE W/O CM
4 of 5 series · 26 of 48 positions shown · non-contrast
Comparison: Radiographs of the cervical spine 02/23/2019

CLINICAL DATA: Neck pain for 8 months radiating to left arm,
question herniated disc

EXAM:
MRI CERVICAL SPINE WITHOUT CONTRAST
TECHNIQUE: Multiplanar, multisequence MR imaging of the cervical spine was
performed. No intravenous contrast was administered.

[Series 6: T1 · sagittal · 3.0mm · 0.66mm/px · 6 of 15 slices shown]
[im 1/15]
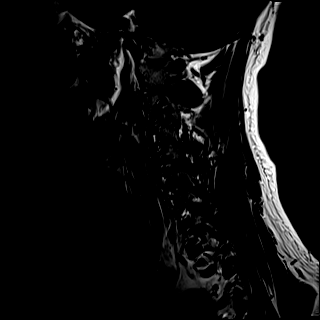
[im 3/15]
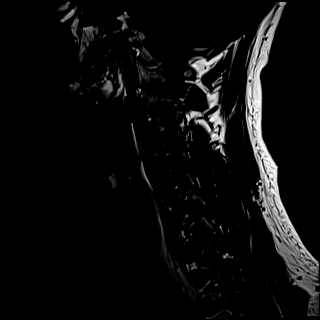
[im 6/15]
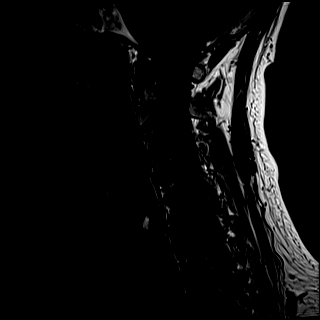
[im 9/15]
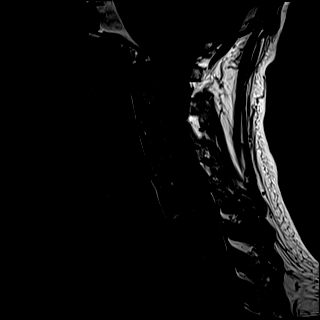
[im 12/15]
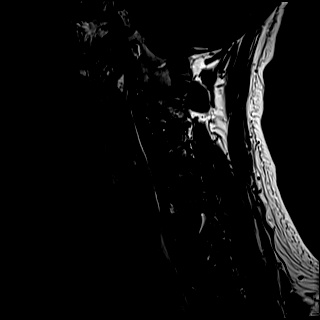
[im 15/15]
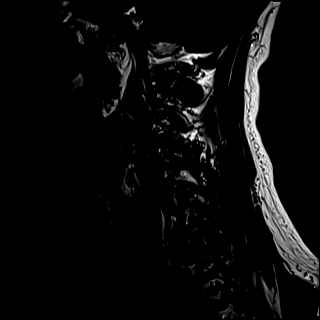

[Series 7: T2 · sagittal · 3.0mm · 0.55mm/px · 7 of 15 slices shown (1 of 2)]
[im 1/15]
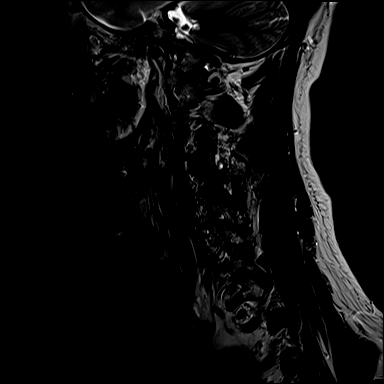
[im 3/15]
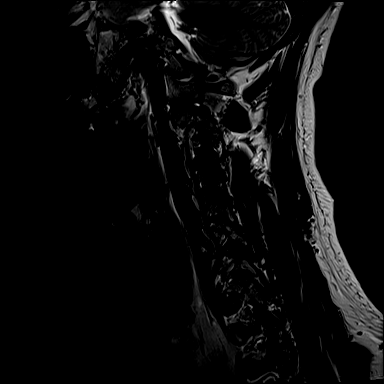
[im 5/15]
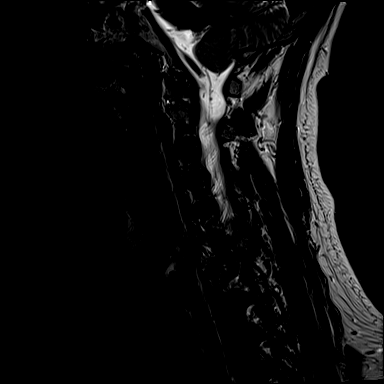
[im 8/15]
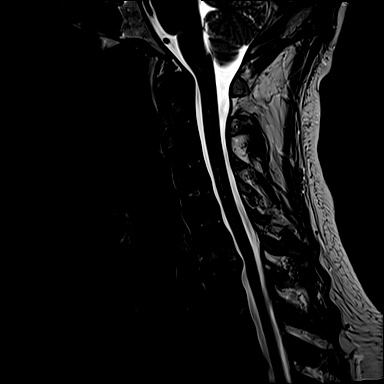
[im 10/15]
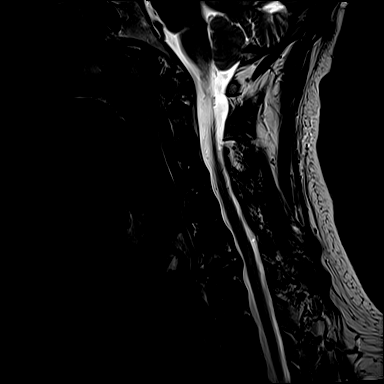
[im 12/15]
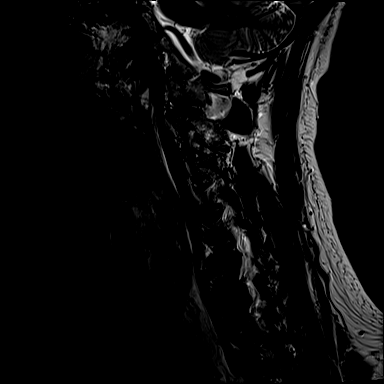
[im 15/15]
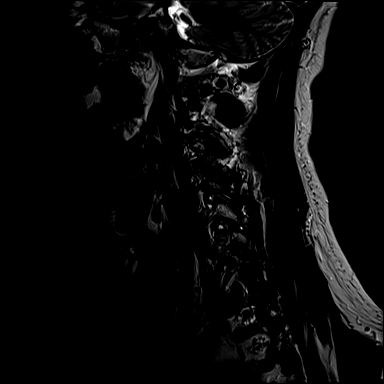

[Series 8: STIR · sagittal · 3.0mm · 0.33mm/px · 5 of 15 slices shown]
[im 1/15]
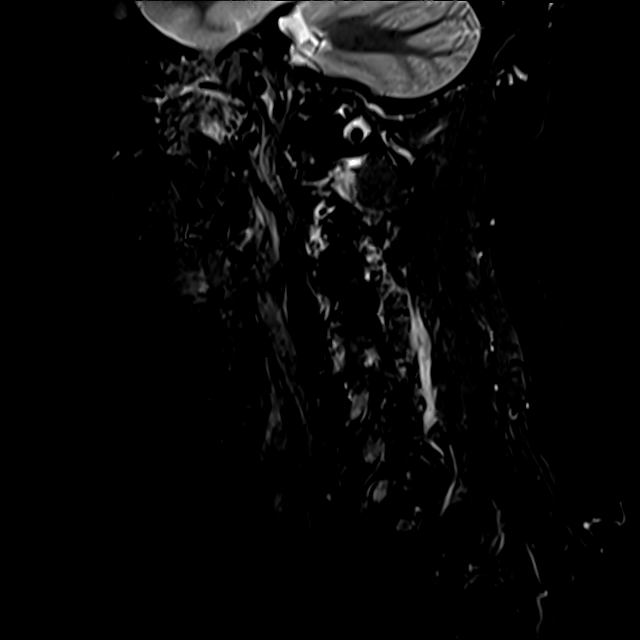
[im 3/15]
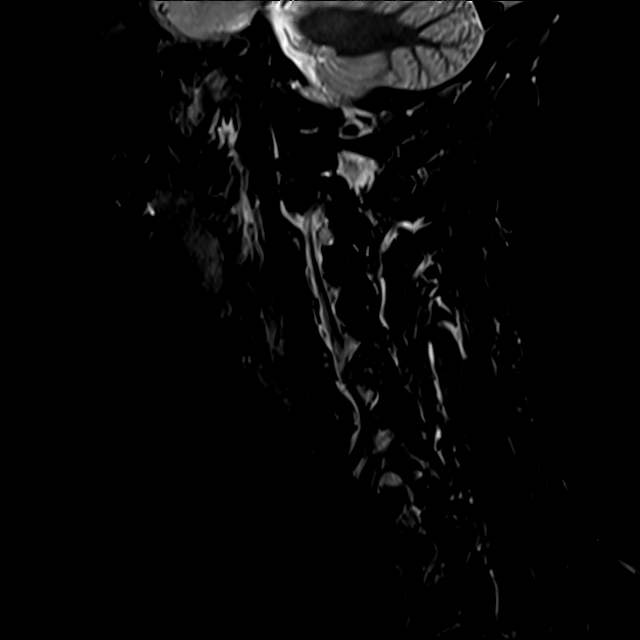
[im 5/15]
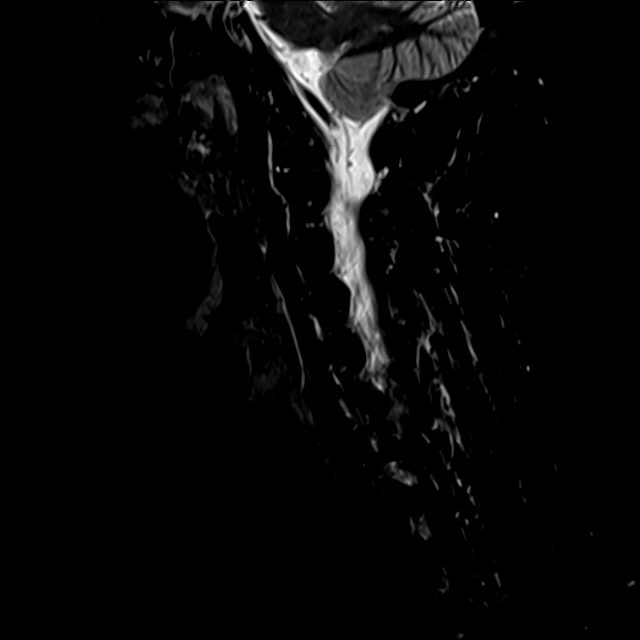
[im 8/15]
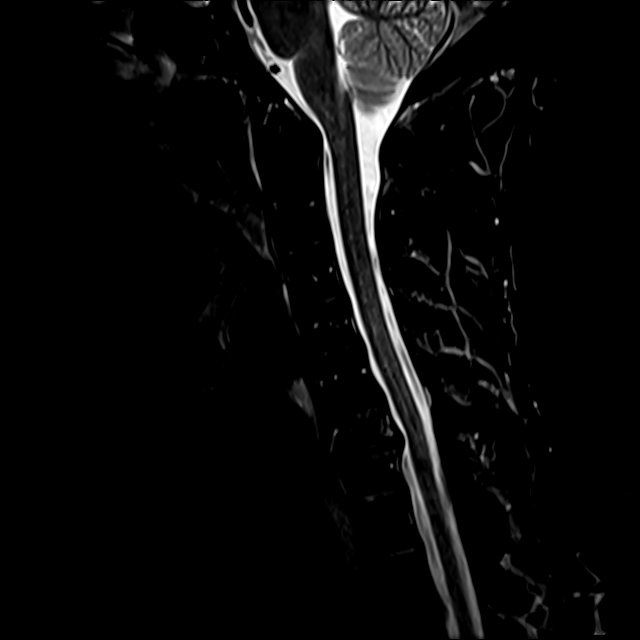
[im 12/15]
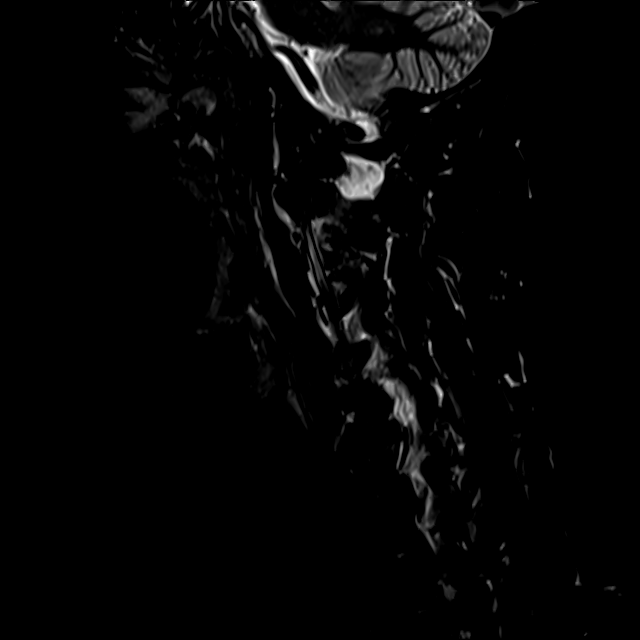

[Series 9: T2 · axial · 3.0mm · 0.50mm/px · z∈[-38,+61]mm · 8 of 32 slices shown (2 of 2)]
[im 1/32]
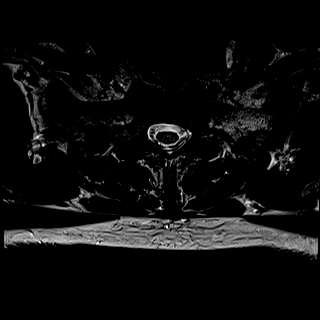
[im 5/32]
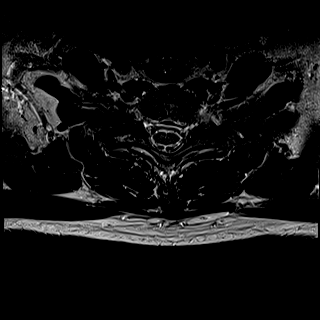
[im 10/32]
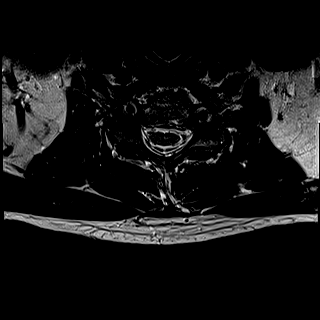
[im 15/32]
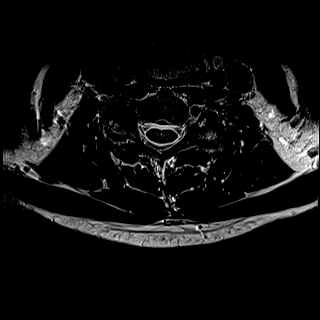
[im 17/32]
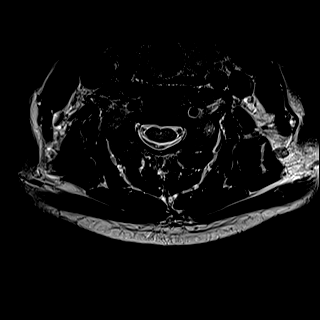
[im 22/32]
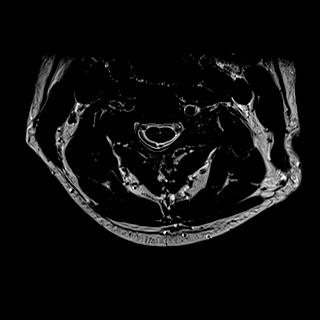
[im 27/32]
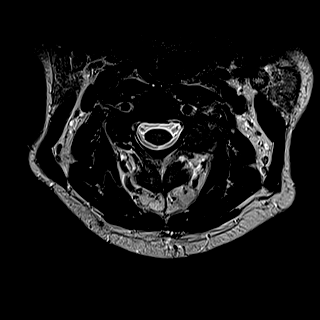
[im 32/32]
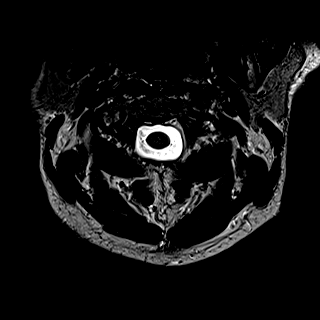

[26 of 48 positions shown; findings below may reference images not displayed]

FINDINGS: Alignment: Reversal of the expected cervical lordosis. 3 mm C5-C6
grade 1 anterolisthesis.

Vertebrae: Vertebral body height is maintained. Degenerative
endplate irregularity and prominent degenerative endplate edema at
C6-C7. Bone marrow signal is otherwise unremarkable.

Cord: No spinal cord signal abnormality.

Posterior Fossa, vertebral arteries, paraspinal tissues: The
cerebellar tonsils extend below the level of the foramen magnum,
particularly on the right (8 mm below the level of the foramen
magnum). The imaged posterior fossa is otherwise unremarkable.
Imaged paraspinal soft tissues unremarkable. Preserved flow voids
within the visualized portions of the cervical vertebral arteries.

Disc levels:

Moderate/advanced C6-C7 disc degeneration. Mild disc degeneration at
the remaining levels.

C2-C3: Facet hypertrophy, predominantly on the left. No significant
spinal canal stenosis or neural foraminal narrowing.

C3-C4: Uncinate/facet hypertrophy. No significant spinal canal
stenosis. Mild bilateral neural foraminal narrowing.

C4-C5: Uncinate/facet hypertrophy. No significant spinal canal
stenosis. Mild to moderate right neural foraminal narrowing.

C5-C6: Grade 1 anterolisthesis. Uncinate/facet hypertrophy. No
significant spinal canal stenosis. Mild right neural foraminal
narrowing.

C6-C7: Posterior disc osteophyte complex with bilateral disc
osteophyte ridge/uncinate hypertrophy. Facet hypertrophy. Mild
spinal canal stenosis. Moderate bilateral neural foraminal narrowing
(greater on the right).

C7-T1: No significant canal or foraminal stenosis.
IMPRESSION: Cervical spondylosis as described and greatest at C6-C7.

At C6-C7, a posterior disc osteophyte complex contributes to mild
spinal canal stenosis. Moderate bilateral neural foraminal narrowing
(greater on the right).

No significant spinal canal stenosis at the remaining levels.
Additional sites of neural foraminal narrowing, greatest on the
right at C4-C5 (mild-to-moderate).

The right cerebellar tonsil extends up to 8 mm below the level of
the foramen magnum. This meets measurement criteria for a Chiari I
malformation. However, the cerebellar tonsils do not have a peg like
configuration, and there does not appear to be significant crowding
at the level of the foramen magnum.
